# Patient Record
Sex: Female | Born: 1995
Health system: Southern US, Community
[De-identification: ages and names within clinical notes are randomized; demographics above are authoritative.]

---
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
## 2016-04-30 LAB — HM PAP SMEAR: HM Pap smear: NEGATIVE

## 2016-12-29 DIAGNOSIS — N946 Dysmenorrhea, unspecified: Secondary | ICD-10-CM | POA: Diagnosis not present

## 2016-12-29 DIAGNOSIS — Z23 Encounter for immunization: Secondary | ICD-10-CM | POA: Diagnosis not present

## 2016-12-29 DIAGNOSIS — N926 Irregular menstruation, unspecified: Secondary | ICD-10-CM | POA: Diagnosis not present

## 2017-02-09 DIAGNOSIS — Z23 Encounter for immunization: Secondary | ICD-10-CM | POA: Diagnosis not present

## 2017-04-08 DIAGNOSIS — D2239 Melanocytic nevi of other parts of face: Secondary | ICD-10-CM | POA: Diagnosis not present

## 2017-04-08 DIAGNOSIS — D485 Neoplasm of uncertain behavior of skin: Secondary | ICD-10-CM | POA: Diagnosis not present

## 2017-06-17 DIAGNOSIS — Z0000 Encounter for general adult medical examination without abnormal findings: Secondary | ICD-10-CM | POA: Diagnosis not present

## 2017-06-17 DIAGNOSIS — Z23 Encounter for immunization: Secondary | ICD-10-CM | POA: Diagnosis not present

## 2017-07-06 DIAGNOSIS — Z111 Encounter for screening for respiratory tuberculosis: Secondary | ICD-10-CM | POA: Diagnosis not present

## 2017-07-06 DIAGNOSIS — Z23 Encounter for immunization: Secondary | ICD-10-CM | POA: Diagnosis not present

## 2018-07-28 ENCOUNTER — Other Ambulatory Visit: Payer: Self-pay

## 2018-07-28 ENCOUNTER — Encounter: Payer: Self-pay | Admitting: Family Medicine

## 2018-07-28 ENCOUNTER — Ambulatory Visit: Payer: BC Managed Care – PPO | Admitting: Family Medicine

## 2018-07-28 VITALS — BP 111/72 | HR 69 | Temp 97.2°F | Ht 62.0 in | Wt 109.8 lb

## 2018-07-28 DIAGNOSIS — Z7689 Persons encountering health services in other specified circumstances: Secondary | ICD-10-CM

## 2018-07-28 DIAGNOSIS — Z111 Encounter for screening for respiratory tuberculosis: Secondary | ICD-10-CM

## 2018-07-28 MED ORDER — NORGESTIM-ETH ESTRAD TRIPHASIC 0.18/0.215/0.25 MG-25 MCG PO TABS: 1.0000 | ORAL_TABLET | Freq: Every day | ORAL | 3 refills | Status: AC

## 2018-07-28 NOTE — Patient Instructions (Signed)
 Preventive Care 21-23 Years Old, Female Preventive care refers to visits with your health care provider and lifestyle choices that can promote health and wellness. This includes:  A yearly physical exam. This may also be called an annual well check.  Regular dental visits and eye exams.  Immunizations.  Screening for certain conditions.  Healthy lifestyle choices, such as eating a healthy diet, getting regular exercise, not using drugs or products that contain nicotine and tobacco, and limiting alcohol use. What can I expect for my preventive care visit? Physical exam Your health care provider will check your:  Height and weight. This may be used to calculate body mass index (BMI), which tells if you are at a healthy weight.  Heart rate and blood pressure.  Skin for abnormal spots. Counseling Your health care provider may ask you questions about your:  Alcohol, tobacco, and drug use.  Emotional well-being.  Home and relationship well-being.  Sexual activity.  Eating habits.  Work and work environment.  Method of birth control.  Menstrual cycle.  Pregnancy history. What immunizations do I need?  Influenza (flu) vaccine  This is recommended every year. Tetanus, diphtheria, and pertussis (Tdap) vaccine  You may need a Td booster every 10 years. Varicella (chickenpox) vaccine  You may need this if you have not been vaccinated. Human papillomavirus (HPV) vaccine  If recommended by your health care provider, you may need three doses over 6 months. Measles, mumps, and rubella (MMR) vaccine  You may need at least one dose of MMR. You may also need a second dose. Meningococcal conjugate (MenACWY) vaccine  One dose is recommended if you are age 19-21 years and a first-year college student living in a residence hall, or if you have one of several medical conditions. You may also need additional booster doses. Pneumococcal conjugate (PCV13) vaccine  You may need  this if you have certain conditions and were not previously vaccinated. Pneumococcal polysaccharide (PPSV23) vaccine  You may need one or two doses if you smoke cigarettes or if you have certain conditions. Hepatitis A vaccine  You may need this if you have certain conditions or if you travel or work in places where you may be exposed to hepatitis A. Hepatitis B vaccine  You may need this if you have certain conditions or if you travel or work in places where you may be exposed to hepatitis B. Haemophilus influenzae type b (Hib) vaccine  You may need this if you have certain conditions. You may receive vaccines as individual doses or as more than one vaccine together in one shot (combination vaccines). Talk with your health care provider about the risks and benefits of combination vaccines. What tests do I need?  Blood tests  Lipid and cholesterol levels. These may be checked every 5 years starting at age 20.  Hepatitis C test.  Hepatitis B test. Screening  Diabetes screening. This is done by checking your blood sugar (glucose) after you have not eaten for a while (fasting).  Sexually transmitted disease (STD) testing.  BRCA-related cancer screening. This may be done if you have a family history of breast, ovarian, tubal, or peritoneal cancers.  Pelvic exam and Pap test. This may be done every 3 years starting at age 21. Starting at age 30, this may be done every 5 years if you have a Pap test in combination with an HPV test. Talk with your health care provider about your test results, treatment options, and if necessary, the need for more   tests. Follow these instructions at home: Eating and drinking   Eat a diet that includes fresh fruits and vegetables, whole grains, lean protein, and low-fat dairy.  Take vitamin and mineral supplements as recommended by your health care provider.  Do not drink alcohol if: ? Your health care provider tells you not to drink. ? You are  pregnant, may be pregnant, or are planning to become pregnant.  If you drink alcohol: ? Limit how much you have to 0-1 drink a day. ? Be aware of how much alcohol is in your drink. In the [place] one drink equals one 12 oz bottle of beer (355 mL), one 5 oz glass of wine (148 mL), or one 1 oz glass of hard liquor (44 mL). Lifestyle  Take daily care of your teeth and gums.  Stay active. Exercise for at least 30 minutes on 5 or more days each week.  Do not use any products that contain nicotine or tobacco, such as cigarettes, e-cigarettes, and chewing tobacco. If you need help quitting, ask your health care provider.  If you are sexually active, practice safe sex. Use a condom or other form of birth control (contraception) in order to prevent pregnancy and STIs (sexually transmitted infections). If you plan to become pregnant, see your health care provider for a preconception visit. What's next?  Visit your health care provider once a year for a well check visit.  Ask your health care provider how often you should have your eyes and teeth checked.  Stay up to date on all vaccines. This information is not intended to replace advice given to you by your health care provider. Make sure you discuss any questions you have with your health care provider. Document Released: 02/25/2001 Document Revised: 09/10/2017 Document Reviewed: 09/10/2017 Elsevier Patient Education  2020 [employer].

## 2018-07-28 NOTE — Progress Notes (Signed)
New Patient Office Visit  Subjective:  Patient ID: Molly Johns, female    DOB: Dec 08, 1995  Age: 23 y.o. MRN: [id number]  CC:  Chief Complaint  Patient presents with  . Establish Care    TB test for school    HPI Molly Johns presents for a TB skin test for school. She is transferring care from Dr. Murrell Redden office as they have closed.   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   She does go to the dentist regularly.  Review of Systems  Constitutional: Negative for chills, fever, malaise/fatigue and weight loss.  HENT: Negative for congestion, ear discharge, ear pain, nosebleeds, sinus pain, sore throat and tinnitus.   Eyes: Negative for blurred vision, double vision, pain, discharge and redness.  Respiratory: Negative for cough, shortness of breath and wheezing.   Cardiovascular: Negative for chest pain, palpitations and leg swelling.  Gastrointestinal: Negative for abdominal pain, constipation, diarrhea, heartburn, nausea and vomiting.  Genitourinary: Negative for dysuria, frequency and urgency.  Musculoskeletal: Negative for myalgias.  Skin: Negative for rash.  Neurological: Negative for dizziness, seizures, weakness and headaches.  Psychiatric/Behavioral: Negative for depression, substance abuse and suicidal ideas. The patient is not nervous/anxious.     Current Outpatient Medications:  .  Norgestimate-Ethinyl Estradiol Triphasic (TRI-LO-MARZIA) 0.18/0.215/0.25 MG-25 MCG tab, Take 1 tablet by mouth daily., Disp: , Rfl:   No Known Allergies  History reviewed. No pertinent past medical history.  History reviewed. No pertinent surgical history.  Family History  Problem Relation Age of Onset  . Hypertension Maternal Grandfather   . Stroke Maternal Grandmother     Social History   Socioeconomic History  . Marital status: Single    Spouse name: Not on file  . Number of children: Not on file  . Years of education: Not on file   . Highest education level: Not on file  Occupational History  . Not on file  Social Needs  . Financial resource strain: Not on file  . Food insecurity    Worry: Not on file    Inability: Not on file  . Transportation needs    Medical: Not on file    Non-medical: Not on file  Tobacco Use  . Smoking status: Never Smoker  . Smokeless tobacco: Never Used  Substance and Sexual Activity  . Alcohol use: Yes    Comment: occasionally social  . Drug use: Never  . Sexual activity: Yes    Birth control/protection: Pill  Lifestyle  . Physical activity    Days per week: Not on file    Minutes per session: Not on file  . Stress: Not on file  Relationships  . Social Herbalist on phone: Not on file    Gets together: Not on file    Attends religious service: Not on file    Active member of club or organization: Not on file    Attends meetings of clubs or organizations: Not on file    Relationship status: Not on file  . Intimate partner violence    Fear of current or ex partner: Not on file    Emotionally abused: Not on file    Physically abused: Not on file    Forced sexual activity: Not on file  Other Topics Concern  . Not on file  Social History Narrative  . Not on file    Objective:   Today's Vitals: BP 111/72   Pulse  69   Temp (!) 97.2 F (36.2 C) (Oral)   Ht 5\' 2"  (1.575 m)   Wt 109 lb 12.8 oz (49.8 kg)   BMI 20.08 kg/m   Physical Exam Vitals signs reviewed.  Constitutional:      General: She is not in acute distress.    Appearance: Normal appearance. She is normal weight. She is not ill-appearing, toxic-appearing or diaphoretic.  HENT:     Head: Normocephalic and atraumatic.     Right Ear: Tympanic membrane, ear canal and external ear normal. There is no impacted cerumen.     Left Ear: Tympanic membrane, ear canal and external ear normal. There is no impacted cerumen.     Nose: Nose normal. No congestion or rhinorrhea.     Mouth/Throat:     Mouth:  Mucous membranes are moist.     Pharynx: Oropharynx is clear. No oropharyngeal exudate or posterior oropharyngeal erythema.  Eyes:     General: No scleral icterus.       Right eye: No discharge.        Left eye: No discharge.     Conjunctiva/sclera: Conjunctivae normal.     Pupils: Pupils are equal, round, and reactive to light.  Neck:     Musculoskeletal: Normal range of motion and neck supple. No neck rigidity or muscular tenderness.  Cardiovascular:     Rate and Rhythm: Normal rate and regular rhythm.     Heart sounds: Normal heart sounds. No murmur. No friction rub. No gallop.   Pulmonary:     Effort: Pulmonary effort is normal. No respiratory distress.     Breath sounds: Normal breath sounds. No stridor. No wheezing, rhonchi or rales.  Musculoskeletal: Normal range of motion.  Lymphadenopathy:     Cervical: No cervical adenopathy.  Skin:    General: Skin is warm and dry.  Neurological:     General: No focal deficit present.     Mental Status: She is alert and oriented to person, place, and time. Mental status is at baseline.  Psychiatric:        Mood and Affect: Mood normal.        Behavior: Behavior normal.        Thought Content: Thought content normal.        Judgment: Judgment normal.     Assessment & Plan:   1. PPD screening test - PPD - Patient to return in 2-3 days to have PPD read.   2. Encounter to establish care - Transferring from Dr.  CopaN[place]'s office.    Follow-up: Return in about 2 days (around 07/30/2018) for PPD reading, then 1 year for annual.   Gwenlyn FudgeBRITNEY F , FNP

## 2018-07-30 LAB — TB SKIN TEST
Induration: 0 mm
TB Skin Test: NEGATIVE

## 2019-04-22 ENCOUNTER — Ambulatory Visit: Payer: Self-pay | Attending: Internal Medicine

## 2019-04-22 DIAGNOSIS — Z23 Encounter for immunization: Secondary | ICD-10-CM

## 2019-04-22 NOTE — Progress Notes (Signed)
   Covid-19 Vaccination Clinic  Name:  Molly Johns    MRN: [id number] DOB: Apr 07, 1995  04/22/2019  Ms. Rosete was observed post Covid-19 immunization for 15 minutes without incident. She was provided with Vaccine Information Sheet and instruction to access the V-Safe system.   Ms. Nicosia was instructed to call 911 with any severe reactions post vaccine: Marland Kitchen Difficulty breathing  . Swelling of face and throat  . A fast heartbeat  . A bad rash all over body  . Dizziness and weakness   Immunizations Administered    Name Date Dose VIS Date Route   Pfizer COVID-19 Vaccine 04/22/2019 12:53 PM 0.3 mL 12/24/2018 Intramuscular   Manufacturer: [employer], [employer]   Lot: [id number]   NDC: [address]-1

## 2019-05-16 ENCOUNTER — Ambulatory Visit: Payer: Self-pay | Attending: Internal Medicine

## 2019-05-16 DIAGNOSIS — Z23 Encounter for immunization: Secondary | ICD-10-CM

## 2019-05-16 NOTE — Progress Notes (Signed)
   Covid-19 Vaccination Clinic  Name:  Molly Johns    MRN: [id number] DOB: 10/09/1995  05/16/2019  [name] was observed post Covid-19 immunization for 15 minutes without incident. She was provided with Vaccine Information Sheet and instruction to access the V-Safe system.   [name] was instructed to call 911 with any severe reactions post vaccine: Marland Kitchen Difficulty breathing  . Swelling of face and throat  . A fast heartbeat  . A bad rash all over body  . Dizziness and weakness   Immunizations Administered    Name Date Dose VIS Date Route   Pfizer COVID-19 Vaccine 05/16/2019 12:20 PM 0.3 mL 03/09/2018 Intramuscular   Manufacturer: [employer], [employer]   Lot: [id number]   NDC: [address]-8

## 2020-04-01 DIAGNOSIS — M25511 Pain in right shoulder: Secondary | ICD-10-CM | POA: Diagnosis not present

## 2020-04-01 DIAGNOSIS — S42123A Displaced fracture of acromial process, unspecified shoulder, initial encounter for closed fracture: Secondary | ICD-10-CM | POA: Diagnosis not present

## 2020-04-02 ENCOUNTER — Other Ambulatory Visit: Payer: Self-pay | Admitting: Orthopedic Surgery

## 2020-04-02 DIAGNOSIS — S42126B Nondisplaced fracture of acromial process, unspecified shoulder, initial encounter for open fracture: Secondary | ICD-10-CM

## 2020-04-02 DIAGNOSIS — M25511 Pain in right shoulder: Secondary | ICD-10-CM | POA: Diagnosis not present

## 2020-04-03 ENCOUNTER — Ambulatory Visit
Admission: RE | Admit: 2020-04-03 | Discharge: 2020-04-03 | Disposition: A | Discharge status: Home / Self Care | Payer: 59 | Source: Ambulatory Visit | Attending: Orthopedic Surgery | Admitting: Orthopedic Surgery

## 2020-04-03 DIAGNOSIS — S42141A Displaced fracture of glenoid cavity of scapula, right shoulder, initial encounter for closed fracture: Secondary | ICD-10-CM | POA: Diagnosis not present

## 2020-04-03 DIAGNOSIS — S42126B Nondisplaced fracture of acromial process, unspecified shoulder, initial encounter for open fracture: Secondary | ICD-10-CM

## 2020-04-03 DIAGNOSIS — S42101A Fracture of unspecified part of scapula, right shoulder, initial encounter for closed fracture: Secondary | ICD-10-CM | POA: Diagnosis not present

## 2020-04-05 DIAGNOSIS — M25511 Pain in right shoulder: Secondary | ICD-10-CM | POA: Diagnosis not present

## 2021-08-07 IMAGING — CT CT SHOULDER*R* W/O CM
2 series · 14 of 29 positions shown, 18 images · non-contrast
Comparison: None.

CLINICAL DATA: Right shoulder pain after fall 3 days ago.

EXAM:
CT OF THE UPPER RIGHT EXTREMITY WITHOUT CONTRAST
TECHNIQUE: Multidetector CT imaging of the upper right extremity was performed
according to the standard protocol.

[Series 6: shoulder 2.00 br40 s3 axial soft · axial · 0.45mm/px · z∈[-904,-750]mm · 9 of 93 slices shown, 12 images]
[im 8/93  soft-tissue]
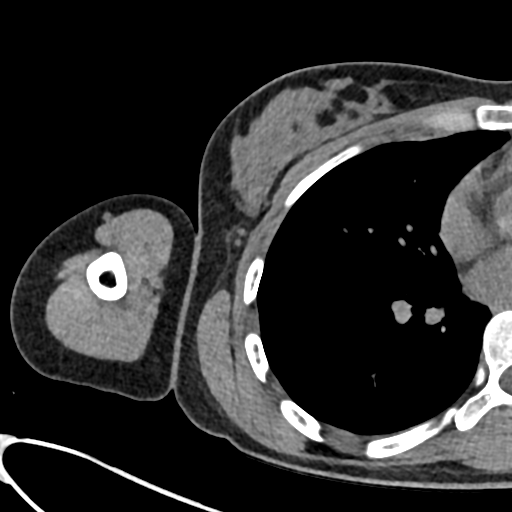
[im 8/93  bone]
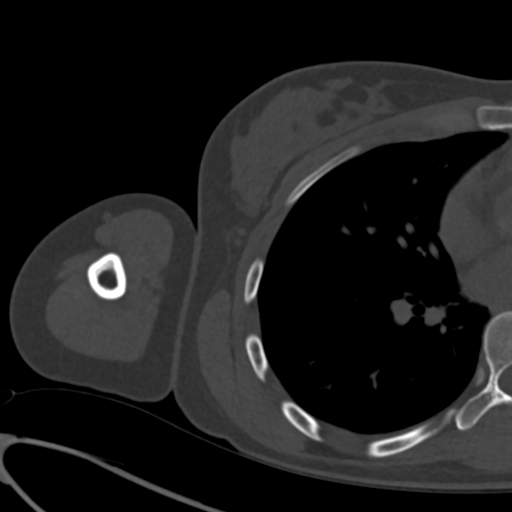
[im 22/93  bone]
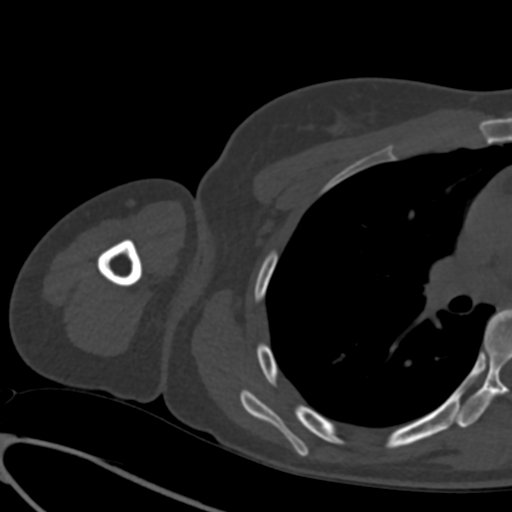
[im 29/93  bone]
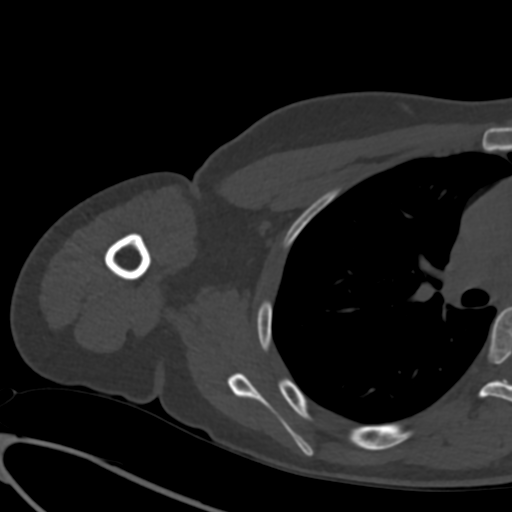
[im 36/93  bone]
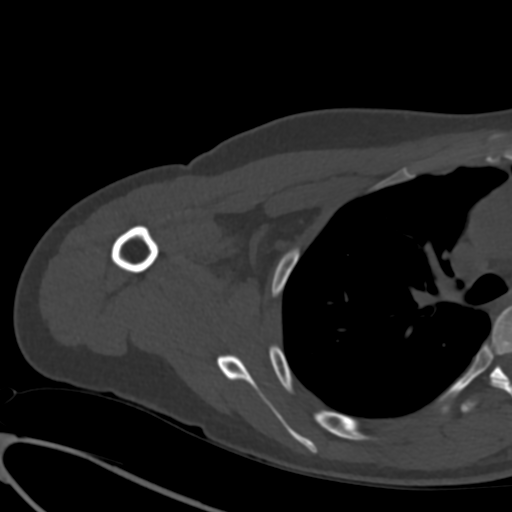
[im 50/93  soft-tissue]
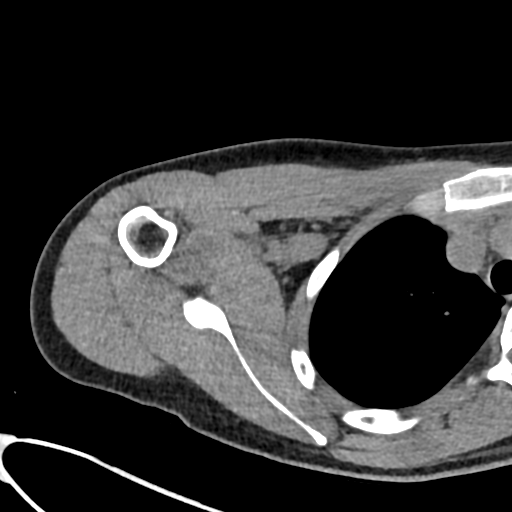
[im 50/93  bone]
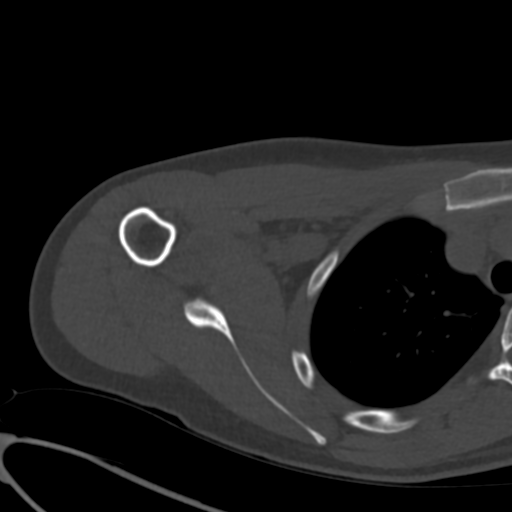
[im 57/93  bone]
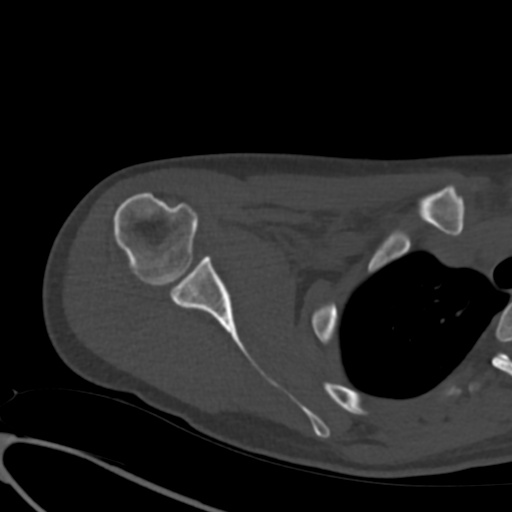
[im 64/93  bone]
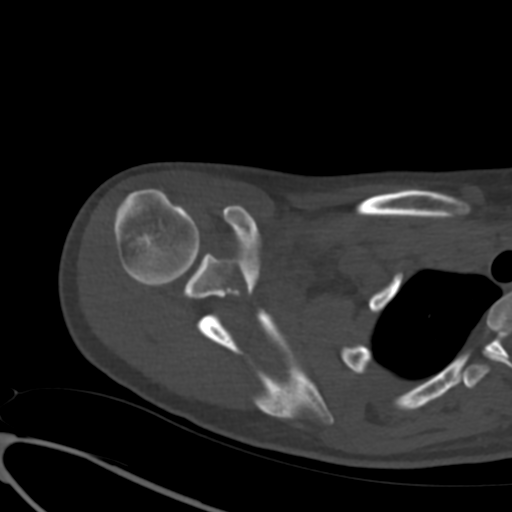
[im 78/93  bone]
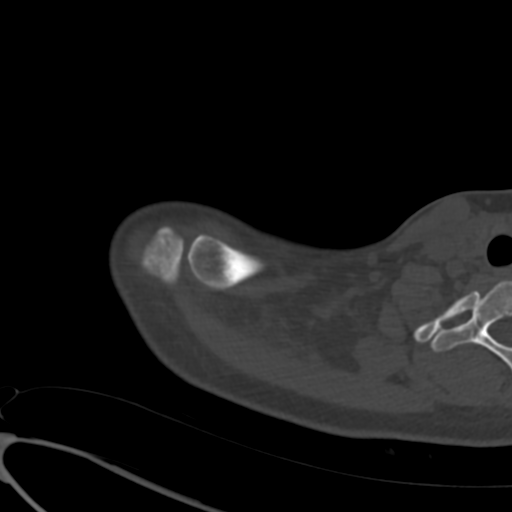
[im 85/93  soft-tissue]
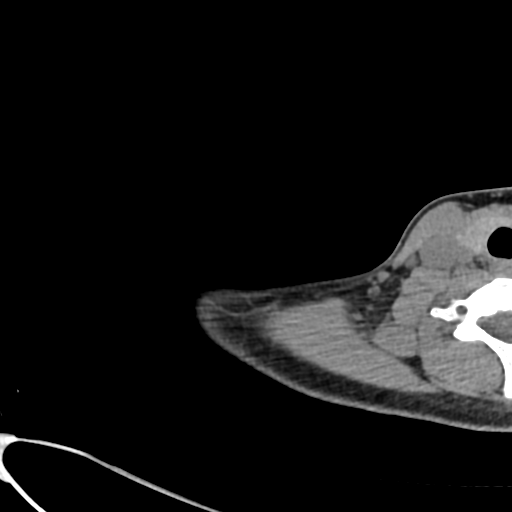
[im 85/93  bone]
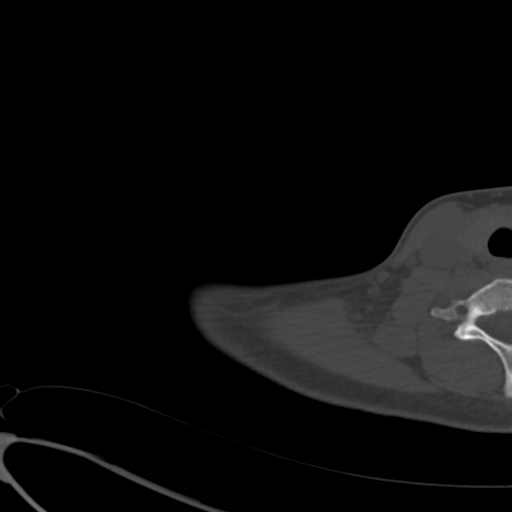

[Series 12: shoulder 2.00 br40 s3 sag soft · sagittal · 0.31mm/px · 5 of 120 slices shown, 6 images]
[im 40/120  bone]
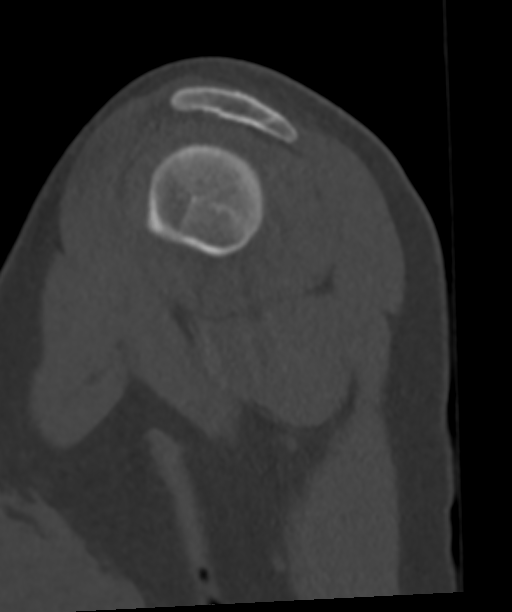
[im 50/120  bone]
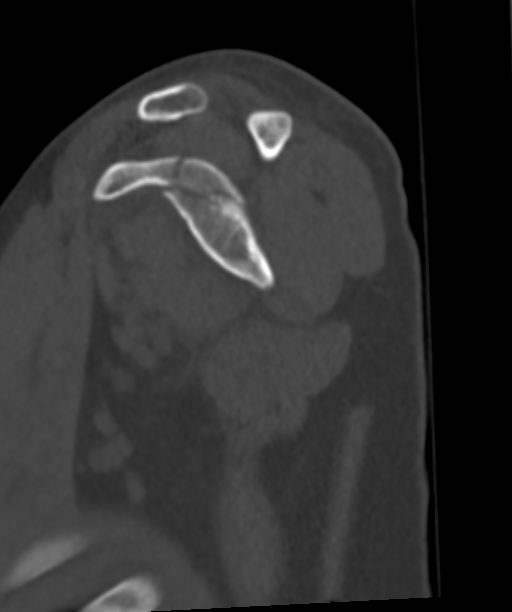
[im 60/120  soft-tissue]
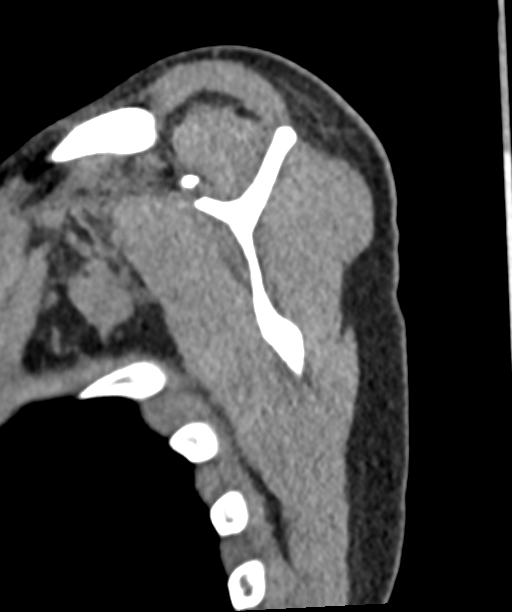
[im 60/120  bone]
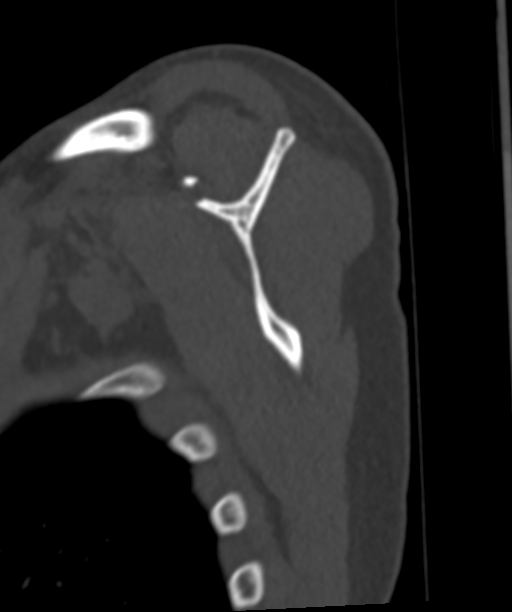
[im 70/120  bone]
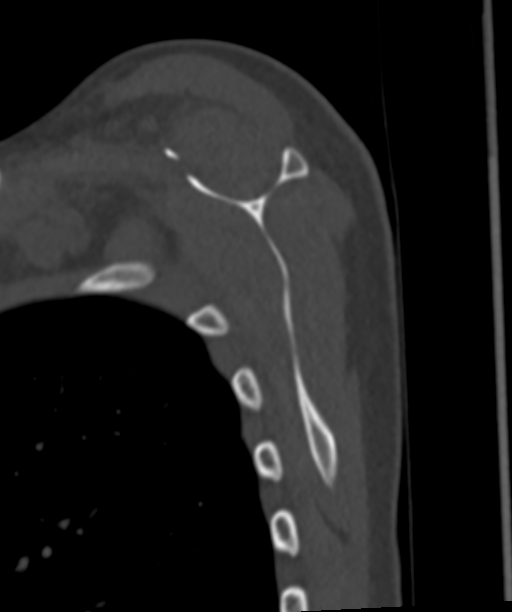
[im 80/120  bone]
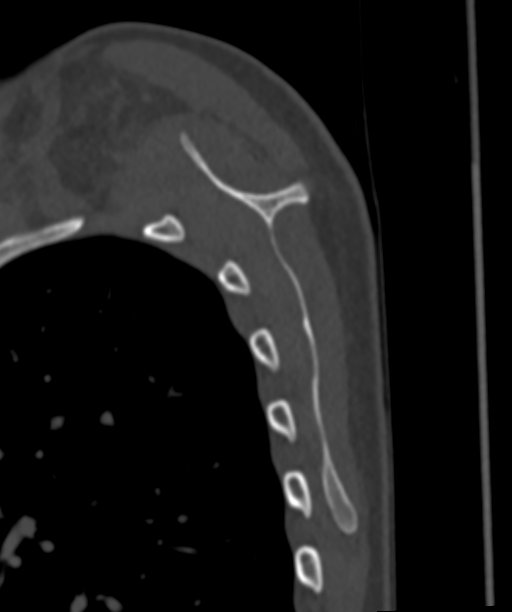

[14 of 29 positions shown; findings below may reference images not displayed]

FINDINGS: Bones/Joint/Cartilage

Acute comminuted intra-articular fracture of the superior glenoid
extending into the superior border of the scapula. The glenoid
component is nondisplaced and involves the base of the coracoid
process (series 14, image 51). The fracture of the superior scapular
border is distracted up to 7 mm.

There is also a tiny acute nondisplaced fracture at the medial base
of the acromion (series 4, image 55; series 14, images 47 and 48).
The clavicle and proximal humerus are intact. No dislocation. Joint
spaces are preserved. No joint effusion.

Ligaments

Ligaments are suboptimally evaluated by CT.

Muscles and Tendons
Grossly intact.  No muscle atrophy.

Soft tissue
No fluid collection or hematoma. No soft tissue mass. The visualized
right lung is clear.
IMPRESSION: 1. Acute comminuted intra-articular fracture of the superior glenoid
extending into the superior border of the scapula as described
above.
2. Tiny acute nondisplaced fracture at the medial base of the
acromion.
